# Patient Record
Sex: Male | Born: 1980 | Race: White | Hispanic: No | Marital: Single | Smoking: Current every day smoker
Health system: Southern US, Community
[De-identification: ages and names within clinical notes are randomized; demographics above are authoritative.]

---

## 2007-06-04 ENCOUNTER — Ambulatory Visit (HOSPITAL_COMMUNITY): Admission: RE | Admit: 2007-06-04 | Discharge: 2007-06-05 | Payer: Self-pay | Admitting: Neurosurgery

## 2011-04-01 NOTE — Op Note (Signed)
NAME:  Raymond Butler, Raymond Butler               ACCOUNT NO.:  [id number]   MEDICAL RECORD NO.:  [id number]          PATIENT TYPE:  AMB   LOCATION:  SDS                          FACILITY:  [facility]   PHYSICIAN:  Henry A. Pool, M.D.    DATE OF BIRTH:  1981/07/08   DATE OF PROCEDURE:  06/04/2007  DATE OF DISCHARGE:                               OPERATIVE REPORT   PREOPERATIVE DIAGNOSIS:  Left L5-S1 herniated nucleus pulposus with  radiculopathy.   POSTOPERATIVE DIAGNOSIS:  Left L5-S1 herniated nucleus pulposus with  radiculopathy.   PROCEDURE NAME:  Left L5-S1 laminotomy and  microdiskectomy.   SURGEON:  Kathaleen Maser. Pool, M.D.   ASSISTANT:  Tia Alert, MD   ANESTHESIA:  General endotracheal.   INDICATIONS:  [name] is a 30 year old male with history of back and  left lower extremity pain, paresthesias and weakness consistent with a  left-sided S1 radiculopathy.  She has failed conservative management,  workup demonstrates evidence of a large left-sided L5-S1 disk herniation  with marked compression left-sided S1 nerve root.  The patient been  counseled as to options.  He decided to proceed with a left-sided L5-S1  laminotomy and microdiskectomy in hopes improving symptoms.   OPERATIVE NOTE:  Patient to operating room, placed operating table in  supine position.  Adequate level anesthesia was achieved, the patient  prone onto Wilson frame, appropriately padded. Patient lumbar region  prepped and draped sterilely.  10 blade made linear skin incision  overlying L5-S1 interspace.  This carried sharply in midline,  subperiosteal dissection performed exposing the lamina facet joints of  L5 and S1 on the left side.  Deep self-retaining retractor was placed.  Intraoperative x-rays taken, level was confirmed.  Laminotomy then  performed using high-speed drill and Kerrison rongeurs to remove the  inferior aspect lamina of L5, medial aspect of L5-S1 facet joint,  superior rim of S1 lamina.  Ligament  flavum was elevated resected  piecemeal fashion using Kerrison rongeurs.  Underlying thecal sac and  exiting S1 nerve root were identified.  Microscope then brought to  field, used for microdissection of the left-sided S1 nerve root  underlying disk region.  Epidural venous plexus was coagulated cut.  Thecal sac and S1 nerve root gradually mobilized and retracted towards  midline.  Disk herniation was readily apparent.  This then incised with  15 blade in rectangular fracture.  Wide disk space clean-out was then  achieved using pituitary rongeurs, upward angled pituitary rongeurs and  Epstein curettes.  Elements of disk herniation completely resected.  All  loose or obviously degenerative disk material removed from the  interspace.  This point a very thorough diskectomy was achieved.  There  is no injury to thecal sac or nerve roots.  Wound was then irrigated  with antibiotic solution.  Gelfoam was placed topically for hemostasis  found to be good.  Microscope and retractors  were removed.  Hemostasis muscle was achieved with electrocautery.  Wound was closed in layers with Vicryl suture.  Steri-Strips and sterile  dressings were applied.  No apparent complications.  Tolerated procedure  well and  he returns recovery room postoperatively.           ______________________________  Kathaleen Maser Pool, M.D.     HAP/MEDQ  D:  06/04/2007  T:  06/05/2007  Job:  [id number]

## 2011-09-01 LAB — CBC
HCT: 42.7
Hemoglobin: 14.6
RBC: 4.78
RDW: 13.5
WBC: 14.3 — ABNORMAL HIGH

## 2011-09-01 LAB — DIFFERENTIAL
Basophils Absolute: 0.1
Eosinophils Relative: 1
Lymphocytes Relative: 24
Lymphs Abs: 3.4 — ABNORMAL HIGH
Monocytes Absolute: 0.8 — ABNORMAL HIGH
Neutro Abs: 9.8 — ABNORMAL HIGH

## 2011-09-01 LAB — TYPE AND SCREEN

## 2011-09-01 LAB — ABO/RH: ABO/RH(D): O POS

## 2021-03-14 ENCOUNTER — Encounter (HOSPITAL_COMMUNITY): Payer: Self-pay

## 2021-03-14 ENCOUNTER — Emergency Department (HOSPITAL_COMMUNITY)
Admission: EM | Admit: 2021-03-14 | Discharge: 2021-03-14 | Disposition: A | Discharge status: Home / Self Care | Payer: Self-pay | Attending: Emergency Medicine | Admitting: Emergency Medicine

## 2021-03-14 ENCOUNTER — Other Ambulatory Visit: Payer: Self-pay

## 2021-03-14 ENCOUNTER — Emergency Department (HOSPITAL_COMMUNITY): Payer: Self-pay

## 2021-03-14 DIAGNOSIS — R0781 Pleurodynia: Secondary | ICD-10-CM | POA: Insufficient documentation

## 2021-03-14 DIAGNOSIS — F17200 Nicotine dependence, unspecified, uncomplicated: Secondary | ICD-10-CM | POA: Insufficient documentation

## 2021-03-14 DIAGNOSIS — S60512A Abrasion of left hand, initial encounter: Secondary | ICD-10-CM | POA: Insufficient documentation

## 2021-03-14 DIAGNOSIS — R000 Tachycardia, unspecified: Secondary | ICD-10-CM | POA: Insufficient documentation

## 2021-03-14 DIAGNOSIS — S60511A Abrasion of right hand, initial encounter: Secondary | ICD-10-CM | POA: Insufficient documentation

## 2021-03-14 LAB — COMPREHENSIVE METABOLIC PANEL WITH GFR
ALT: 16 U/L (ref 0–44)
AST: 25 U/L (ref 15–41)
Albumin: 3.7 g/dL (ref 3.5–5.0)
Alkaline Phosphatase: 75 U/L (ref 38–126)
Anion gap: 9 (ref 5–15)
BUN: 16 mg/dL (ref 6–20)
CO2: 23 mmol/L (ref 22–32)
Calcium: 9.2 mg/dL (ref 8.9–10.3)
Chloride: 103 mmol/L (ref 98–111)
Creatinine, Ser: 0.83 mg/dL (ref 0.61–1.24)
GFR, Estimated: >60 mL/min
Glucose, Bld: 102 mg/dL — ABNORMAL HIGH (ref 70–99)
Potassium: 3.9 mmol/L (ref 3.5–5.1)
Sodium: 135 mmol/L (ref 135–145)
Total Bilirubin: 0.6 mg/dL (ref 0.3–1.2)
Total Protein: 8 g/dL (ref 6.5–8.1)

## 2021-03-14 LAB — CBC WITH DIFFERENTIAL/PLATELET
Abs Immature Granulocytes: 0.04 K/uL (ref 0.00–0.07)
Basophils Absolute: 0.1 K/uL (ref 0.0–0.1)
Basophils Relative: 0 %
Eosinophils Absolute: 0.1 K/uL (ref 0.0–0.5)
Eosinophils Relative: 1 %
HCT: 39.7 % (ref 39.0–52.0)
Hemoglobin: 12.7 g/dL — ABNORMAL LOW (ref 13.0–17.0)
Immature Granulocytes: 0 %
Lymphocytes Relative: 10 %
Lymphs Abs: 1.3 K/uL (ref 0.7–4.0)
MCH: 27.4 pg (ref 26.0–34.0)
MCHC: 32 g/dL (ref 30.0–36.0)
MCV: 85.7 fL (ref 80.0–100.0)
Monocytes Absolute: 0.8 K/uL (ref 0.1–1.0)
Monocytes Relative: 6 %
Neutro Abs: 11.6 K/uL — ABNORMAL HIGH (ref 1.7–7.7)
Neutrophils Relative %: 83 %
Platelets: 250 K/uL (ref 150–400)
RBC: 4.63 MIL/uL (ref 4.22–5.81)
RDW: 13.9 % (ref 11.5–15.5)
WBC: 14 K/uL — ABNORMAL HIGH (ref 4.0–10.5)
nRBC: 0 % (ref 0.0–0.2)

## 2021-03-14 MED ORDER — KETOROLAC TROMETHAMINE 30 MG/ML IJ SOLN: 15.0000 mg | Freq: Once | INTRAMUSCULAR | Status: DC

## 2021-03-14 MED ORDER — KETOROLAC TROMETHAMINE 30 MG/ML IJ SOLN
30.0000 mg | Freq: Once | INTRAMUSCULAR | Status: AC
  Administered 2021-03-14: 30 mg via INTRAVENOUS
  Filled 2021-03-14: qty 1

## 2021-03-14 MED ORDER — SODIUM CHLORIDE 0.9 % IV BOLUS
1000.0000 mL | Freq: Once | INTRAVENOUS | Status: AC
  Administered 2021-03-14: 1000 mL via INTRAVENOUS

## 2021-03-14 NOTE — ED Notes (Signed)
Taken to CT by transporter at this time. 
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
## 2021-03-14 NOTE — ED Notes (Signed)
Unable to place an IV with multiple attempts. IV team STAT order in place for IV placement. Unable to draw ethanol level at this time.

## 2021-03-14 NOTE — ED Notes (Signed)
IV team at bedside 

## 2021-03-14 NOTE — ED Notes (Signed)
Pt not able to sign E-waiver due to pain in hands
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
## 2021-03-14 NOTE — ED Notes (Signed)
Pt is cleared for discharge but is waiting for a ride. Attempted to call the deputy with no response. Pt lives in [address] in [place]. Social worker notified to see how pt can get home.

## 2021-03-14 NOTE — ED Notes (Signed)
IV team unable to draw ethanol lab at this time. IV in place. Will notify MD.
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
## 2021-03-14 NOTE — ED Notes (Addendum)
Patient unable to sign [facility]

## 2021-03-14 NOTE — ED Notes (Signed)
[id number] called at this time to see for transportation dispo.

## 2021-10-17 DEATH — deceased
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
